# Patient Record
Sex: Female | Born: 1955 | Race: Black or African American | Hispanic: No | Marital: Married | Smoking: Former smoker
Health system: Southern US, Community
[De-identification: ages and names within clinical notes are randomized; demographics above are authoritative.]

## PROBLEM LIST (undated history)

## (undated) DIAGNOSIS — F1910 Other psychoactive substance abuse, uncomplicated: Secondary | ICD-10-CM

## (undated) DIAGNOSIS — E785 Hyperlipidemia, unspecified: Secondary | ICD-10-CM

## (undated) DIAGNOSIS — T7840XA Allergy, unspecified, initial encounter: Secondary | ICD-10-CM

## (undated) HISTORY — DX: Other psychoactive substance abuse, uncomplicated: F19.10

## (undated) HISTORY — DX: Allergy, unspecified, initial encounter: T78.40XA

## (undated) HISTORY — DX: Hyperlipidemia, unspecified: E78.5

---

## 1989-02-18 HISTORY — PX: INDUCED ABORTION: SHX677

## 1998-08-27 ENCOUNTER — Emergency Department (HOSPITAL_COMMUNITY): Admission: EM | Admit: 1998-08-27 | Discharge: 1998-08-27 | Payer: Self-pay | Admitting: *Deleted

## 1999-04-04 ENCOUNTER — Other Ambulatory Visit: Admission: RE | Admit: 1999-04-04 | Discharge: 1999-04-04 | Payer: Self-pay | Admitting: Obstetrics and Gynecology

## 2000-08-25 ENCOUNTER — Other Ambulatory Visit: Admission: RE | Admit: 2000-08-25 | Discharge: 2000-08-25 | Payer: Self-pay | Admitting: Obstetrics and Gynecology

## 2001-08-26 ENCOUNTER — Other Ambulatory Visit: Admission: RE | Admit: 2001-08-26 | Discharge: 2001-08-26 | Payer: Self-pay | Admitting: Obstetrics and Gynecology

## 2004-05-21 ENCOUNTER — Emergency Department (HOSPITAL_COMMUNITY): Admission: EM | Admit: 2004-05-21 | Discharge: 2004-05-22 | Payer: Self-pay | Admitting: Emergency Medicine

## 2005-03-05 ENCOUNTER — Emergency Department (HOSPITAL_COMMUNITY): Admission: EM | Admit: 2005-03-05 | Discharge: 2005-03-05 | Payer: Self-pay | Admitting: Emergency Medicine

## 2005-05-07 ENCOUNTER — Emergency Department (HOSPITAL_COMMUNITY): Admission: EM | Admit: 2005-05-07 | Discharge: 2005-05-07 | Payer: Self-pay | Admitting: Emergency Medicine

## 2005-06-26 ENCOUNTER — Ambulatory Visit (HOSPITAL_COMMUNITY): Admission: RE | Admit: 2005-06-26 | Discharge: 2005-06-26 | Payer: Self-pay | Admitting: Obstetrics

## 2006-01-01 ENCOUNTER — Emergency Department (HOSPITAL_COMMUNITY): Admission: EM | Admit: 2006-01-01 | Discharge: 2006-01-01 | Payer: Self-pay | Admitting: Emergency Medicine

## 2006-02-05 ENCOUNTER — Inpatient Hospital Stay (HOSPITAL_COMMUNITY): Admission: AD | Admit: 2006-02-05 | Discharge: 2006-02-05 | Payer: Self-pay | Admitting: Obstetrics

## 2006-02-25 ENCOUNTER — Inpatient Hospital Stay (HOSPITAL_COMMUNITY): Admission: AD | Admit: 2006-02-25 | Discharge: 2006-02-25 | Payer: Self-pay | Admitting: Obstetrics & Gynecology

## 2006-07-03 ENCOUNTER — Inpatient Hospital Stay (HOSPITAL_COMMUNITY): Admission: AD | Admit: 2006-07-03 | Discharge: 2006-07-03 | Payer: Self-pay | Admitting: Obstetrics & Gynecology

## 2006-07-18 ENCOUNTER — Inpatient Hospital Stay (HOSPITAL_COMMUNITY): Admission: RE | Admit: 2006-07-18 | Discharge: 2006-07-18 | Payer: Self-pay | Admitting: Obstetrics & Gynecology

## 2006-08-29 ENCOUNTER — Encounter: Payer: Self-pay | Admitting: Obstetrics

## 2006-08-29 ENCOUNTER — Ambulatory Visit (HOSPITAL_COMMUNITY): Admission: RE | Admit: 2006-08-29 | Discharge: 2006-08-29 | Payer: Self-pay | Admitting: Obstetrics

## 2006-09-11 ENCOUNTER — Inpatient Hospital Stay (HOSPITAL_COMMUNITY): Admission: RE | Admit: 2006-09-11 | Discharge: 2006-09-11 | Payer: Self-pay | Admitting: Obstetrics & Gynecology

## 2007-01-19 HISTORY — PX: ABDOMINAL HYSTERECTOMY: SHX81

## 2007-01-29 ENCOUNTER — Encounter: Payer: Self-pay | Admitting: Obstetrics

## 2007-01-29 ENCOUNTER — Inpatient Hospital Stay (HOSPITAL_COMMUNITY): Admission: RE | Admit: 2007-01-29 | Discharge: 2007-01-31 | Payer: Self-pay | Admitting: Obstetrics

## 2008-11-25 ENCOUNTER — Ambulatory Visit (HOSPITAL_COMMUNITY): Admission: RE | Admit: 2008-11-25 | Discharge: 2008-11-25 | Payer: Self-pay | Admitting: Obstetrics

## 2009-04-11 ENCOUNTER — Encounter (INDEPENDENT_AMBULATORY_CARE_PROVIDER_SITE_OTHER): Payer: Self-pay | Admitting: *Deleted

## 2009-04-14 ENCOUNTER — Ambulatory Visit: Payer: Self-pay | Admitting: Gastroenterology

## 2009-04-21 ENCOUNTER — Ambulatory Visit: Payer: Self-pay | Admitting: Gastroenterology

## 2009-04-26 ENCOUNTER — Encounter: Payer: Self-pay | Admitting: Gastroenterology

## 2009-12-14 ENCOUNTER — Ambulatory Visit (HOSPITAL_COMMUNITY): Admission: RE | Admit: 2009-12-14 | Discharge: 2009-12-14 | Payer: Self-pay | Admitting: Obstetrics

## 2010-01-24 ENCOUNTER — Ambulatory Visit: Payer: Self-pay | Admitting: Emergency Medicine

## 2010-01-24 DIAGNOSIS — E785 Hyperlipidemia, unspecified: Secondary | ICD-10-CM

## 2010-01-24 DIAGNOSIS — J069 Acute upper respiratory infection, unspecified: Secondary | ICD-10-CM | POA: Insufficient documentation

## 2010-01-29 ENCOUNTER — Telehealth (INDEPENDENT_AMBULATORY_CARE_PROVIDER_SITE_OTHER): Payer: Self-pay | Admitting: *Deleted

## 2010-03-11 ENCOUNTER — Encounter: Payer: Self-pay | Admitting: Obstetrics

## 2010-03-22 NOTE — Progress Notes (Signed)
  Phone Note Outgoing Call Call back at [facility] Phone [phone]   Call placed by: Emilio Math,  January 29, 2010 9:28 AM Call placed to: Patient Summary of Call: Feeling much better and loved our facility and treatment.

## 2010-03-22 NOTE — Assessment & Plan Note (Signed)
Summary: COUGH,CONGESTION/TJ   Vital Signs:  Patient Profile:   55 Years Old Female CC:      pt c/o cough, congestion and chest hurting x 1wk Height:     67 inches Weight:      174.25 pounds O2 Sat:      98 % O2 treatment:    Room Air Temp:     98.5 degrees F oral Pulse rate:   74 / minute Resp:     18 per minute BP sitting:   128 / 85  (left arm) Cuff size:   regular                  Updated Prior Medication List: LAMISIL 250 MG TABS (TERBINAFINE HCL)   Current Allergies: No known allergies History of Present Illness History from: patient Chief Complaint: pt c/o cough, congestion and chest hurting x 1wk History of Present Illness: Patient complains of onset of cold symptoms for 8-9 days.  She has been using nothing OTC.  She thinks she is getting worse in the last few days, especially the cough. They are replacing wet & mildewy carpets in their house and there is a possibility of mold.  She believes this may be a cause of her current illness. + sore throat + cough + pleuritic pain + wheezing + nasal congestion + chest congestion + post-nasal drainage No sinus pain/pressure No itchy/red eyes No earache No hemoptysis No SOB + chills/sweats + fever No nausea No vomiting No abdominal pain No diarrhea No skin rashes No fatigue No myalgias No headache   REVIEW OF SYSTEMS Constitutional Symptoms       Complains of night sweats.     Denies fever, chills, weight loss, weight gain, and fatigue.  Eyes       Denies change in vision, eye pain, eye discharge, glasses, contact lenses, and eye surgery. Ear/Nose/Throat/Mouth       Complains of sore throat.      Denies hearing loss/aids, change in hearing, ear pain, ear discharge, dizziness, frequent runny nose, frequent nose bleeds, sinus problems, hoarseness, and tooth pain or bleeding.  Respiratory       Complains of productive cough, wheezing, and shortness of breath.      Denies dry cough, asthma, bronchitis, and  emphysema/COPD.  Cardiovascular       Complains of chest pain.      Denies murmurs and tires easily with exhertion.    Gastrointestinal       Denies stomach pain, nausea/vomiting, diarrhea, constipation, blood in bowel movements, and indigestion. Genitourniary       Denies painful urination, kidney stones, and loss of urinary control. Neurological       Denies paralysis, seizures, and fainting/blackouts. Musculoskeletal       Denies muscle pain, joint pain, joint stiffness, decreased range of motion, redness, swelling, muscle weakness, and gout.  Skin       Denies bruising, unusual mles/lumps or sores, and hair/skin or nail changes.  Psych       Denies mood changes, temper/anger issues, anxiety/stress, speech problems, depression, and sleep problems. Other Comments: pt c/o cough, chest congestion, chest hurting x 1wk. she states that the cough is productive with yellow phlegm. she has not taken any OTC meds for this.    Past History:  Past Medical History: Hyperlipidemia  Past Surgical History: Hysterectomy  Family History: Family History Diabetes 1st degree relative mother deceased from [place] unsure of type sister mental illness father MI (deceased)  Social  History: Current Smoker 1 PPD Alcohol use-yes 1-3 drinks per mth Drug use-no Smoking Status:  current Drug Use:  no Physical Exam General appearance: well developed, well nourished, mild distress from coughing Nasal: clear discharge Oral/Pharynx: pharyngeal erythema without exudate, uvula midline without deviation Neck: neck supple,  trachea midline, no masses Chest/Lungs: scattered wheezes and rhonchi throughout all lobes but worse LLL Heart: regular rate and  rhythm, no murmur Skin: no obvious rashes or lesions MSE: oriented to time, place, and person Assessment New Problems: COUGH (ICD-786.2) FAMILY HISTORY DIABETES 1ST DEGREE RELATIVE (ICD-V18.0) HYPERLIPIDEMIA (ICD-272.4) UPPER RESPIRATORY INFECTION  (ICD-465.9)  Acute bronchitis CXR obtained to r/o pneumonia = hyperinflation but no airspace disease  Patient Education: Patient and/or caregiver instructed in the following: rest, fluids, Tylenol prn.  Plan New Medications/Changes: CHERATUSSIN AC 100-10 MG/5ML SYRP (GUAIFENESIN-CODEINE) 5cc Q4-6 hrs as needed for cough  #6oz x 0, 01/24/2010, Hoyt Koch MD ZITHROMAX Z-PAK 250 MG TABS (AZITHROMYCIN) use as directed  #1 x 0, 01/24/2010, Hoyt Koch MD  New Orders: New Patient Level III [phone] T-Chest x-ray, 2 views [71020] Pulse Oximetry (single measurment) [94760] Rocephin  250mg  [J0696] Admin of Therapeutic Inj  intramuscular or subcutaneous [96372] Planning Comments:   1)  Take the prescribed antibiotic as instructed. 2)  Use nasal saline solution (over the counter) at least 3 times a day. 3)  Use over the counter decongestants like Zyrtec-D every 12 hours as needed to help with congestion. 4)  Can take tylenol every 6 hours or motrin every 8 hours for pain or fever. 5)  Follow up with your primary doctor  if no improvement in 5-7 days, sooner if increasing pain, fever, or new symptoms.   Two 5cc samples of Rezira (hydrocodone + sudafed) given for tonight   The patient and/or caregiver has been counseled thoroughly with regard to medications prescribed including dosage, schedule, interactions, rationale for use, and possible side effects and they verbalize understanding.  Diagnoses and expected course of recovery discussed and will return if not improved as expected or if the condition worsens. Patient and/or caregiver verbalized understanding.  Prescriptions: CHERATUSSIN AC 100-10 MG/5ML SYRP (GUAIFENESIN-CODEINE) 5cc Q4-6 hrs as needed for cough  #6oz x 0   Entered and Authorized by:   Hoyt Koch MD   Signed by:   Hoyt Koch MD on 01/24/2010   Method used:   Print then Give to Patient   RxID:   [id number] VZDGLOVFI Z-PAK 250 MG TABS  (AZITHROMYCIN) use as directed  #1 x 0   Entered and Authorized by:   Hoyt Koch MD   Signed by:   Hoyt Koch MD on 01/24/2010   Method used:   Print then Give to Patient   RxID:   [id number]   Medication Administration  Injection # 1:    Medication: Rocephin  250mg     Diagnosis: COUGH (ICD-786.2)    Route: IM    Site: RUOQ gluteus    Exp Date: 06/18/2012    Lot #: [id number]    Mfr: sandoz    Comments: 1 gram given    Patient tolerated injection without complications    Given by: Clemens Catholic LPN (January 24, 2010 8:09 PM)  Orders Added: 1)  New Patient Level III [99203] 2)  T-Chest x-ray, 2 views [71020] 3)  Pulse Oximetry (single measurment) [94760] 4)  Rocephin  250mg  [J0696] 5)  Admin of Therapeutic Inj  intramuscular or subcutaneous [93235]

## 2010-03-22 NOTE — Procedures (Signed)
Summary: Colonoscopy  Patient: [name] Note: All result statuses are Final unless otherwise noted.  Tests: (1) Colonoscopy (COL)   COL Colonoscopy           DONE     [facility]     [address]     [place], [place]  [address]           COLONOSCOPY PROCEDURE REPORT           PATIENT:  [name], [name]  MR#:  [id number]     BIRTHDATE:  1955-05-08, 53 yrs. old  GENDER:  female           ENDOSCOPIST:  Candice Hair. Arlyce Dice, MD     Referred by:           PROCEDURE DATE:  04/21/2009     PROCEDURE:  Colonoscopy with snare polypectomy     ASA CLASS:  Class II     INDICATIONS:  Elevated Risk Screening, family history of colon     cancer Mother at 29           MEDICATIONS:   Fentanyl 100 mcg IV, Versed 10 mg IV           DESCRIPTION OF PROCEDURE:   After the risks benefits and     alternatives of the procedure were thoroughly explained, informed     consent was obtained.  Digital rectal exam was performed and     revealed no abnormalities.   The LB CF-[id number] endoscope     was introduced through the anus and advanced to the cecum, which     was identified by both the appendix and ileocecal valve, without     limitations.  The quality of the prep was Moviprep fair.  The     instrument was then slowly withdrawn as the colon was fully     examined.     <<PROCEDUREIMAGES>>           FINDINGS:  A sessile polyp was found in the descending colon. It     was 3 mm in size. Polyp was snared without cautery. Retrieval was     successful (see image11). snare polyp  A single polyp was found in     the sigmoid colon. It was 3 mm in size. It was found 25 cm from     the point of entry. Polyp was snared without cautery. Retrieval     was successful (see image12). snare polyp  Scattered diverticula     were found in the descending colon (see image1). and hepatic     flexure  This was otherwise a normal examination of the colon (see     image3, image5, and image13).   Retroflexed  views in the rectum     revealed no abnormalities.    The scope was then withdrawn from     the patient and the procedure completed.           COMPLICATIONS:  None           ENDOSCOPIC IMPRESSION:     1) 3 mm sessile polyp in the descending colon     2) 3 mm polyp, multiple in the sigmoid colon     3) Diverticula, scattered in the descending colon and hepatic     flexure     4) Otherwise normal examination     RECOMMENDATIONS:     1) Given your significant family history of colon cancer, you  should have a repeat colonoscopy in 5 years           REPEAT EXAM:  In 5 year(s) for Colonoscopy.           ______________________________     Candice Hair. Arlyce Dice, MD           CC:  Candice Ceo MD           n.     [name]:   [name] at 04/21/2009 03:40 PM           Balinda Quails, [id number]  Note: An exclamation mark (!) indicates a result that was not dispersed into the flowsheet. Document Creation Date: 04/21/2009 3:41 PM _______________________________________________________________________  (1) Order result status: Final Collection or observation date-time: 04/21/2009 15:30 Requested date-time:  Receipt date-time:  Reported date-time:  Referring Physician:   Ordering Physician: [name] [phone]) Specimen Source:  Source: Launa Grill Order Number: [id number] Lab site:   Appended Document: Colonoscopy recall     Procedures Next Due Date:    Colonoscopy: 04/2014

## 2010-03-22 NOTE — Miscellaneous (Signed)
Summary: DIR COL/AGE...AS.  Clinical Lists Changes  Medications: Added new medication of MOVIPREP 100 GM  SOLR (PEG-KCL-NACL-NASULF-NA ASC-C) As directed - Signed Rx of MOVIPREP 100 GM  SOLR (PEG-KCL-NACL-NASULF-NA ASC-C) As directed;  #1 x 0;  Signed;  Entered by: Clide Cliff RN;  Authorized by: Louis Meckel MD;  Method used: Electronically to [facility] [address] [phone]*, [address], [address] [place], [place]  [address], Ph: [phone], Fax: [phone] Observations: Added new observation of ALLERGY REV: Done (04/14/2009 13:00)    Prescriptions: MOVIPREP 100 GM  SOLR (PEG-KCL-NACL-NASULF-NA ASC-C) As directed  #1 x 0   Entered by:   Clide Cliff RN   Authorized by:   Louis Meckel MD   Signed by:   Clide Cliff RN on 04/14/2009   Method used:   Electronically to        [facility] [address] [phone]* (retail)       [address]       [address]       [place], [place]  [address]       Ph: [phone]       Fax: [phone]   RxID:   [id number]

## 2010-03-22 NOTE — Letter (Signed)
Summary: Previsit letter  [facility]  [address] [place], [place] [address]   Phone: [phone]  Fax: [phone]       04/11/2009 MRN: [id number]  [facility] [address] [place], [place]  [address]  Dear Ms. Mun,  Welcome to the Gastroenterology Division at [facility].    You are scheduled to see a nurse for your pre-procedure visit on 04/14/2009 at 1:00PM on the 3rd floor at [facility], [address]  We ask that you try to arrive at our office 15 minutes prior to your appointment time to allow for check-in.  Your nurse visit will consist of discussing your medical and surgical history, your immediate family medical history, and your medications.    Please bring a complete list of all your medications or, if you prefer, bring the medication bottles and we will list them.  We will need to be aware of both prescribed and over the counter drugs.  We will need to know exact dosage information as well.  If you are on blood thinners (Coumadin, Plavix, Aggrenox, Ticlid, etc.) please call our office today/prior to your appointment, as we need to consult with your physician about holding your medication.   Please be prepared to read and sign documents such as consent forms, a financial agreement, and acknowledgement forms.  If necessary, and with your consent, a friend or relative is welcome to sit-in on the nurse visit with you.  Please bring your insurance card so that we may make a copy of it.  If your insurance requires a referral to see a specialist, please bring your referral form from your primary care physician.  No co-pay is required for this nurse visit.     If you cannot keep your appointment, please call [phone] to cancel or reschedule prior to your appointment date.  This allows [place] the opportunity to schedule an appointment for another patient in need of care.    Thank you for choosing [facility] for your medical needs.   We appreciate the opportunity to care for you.  Please visit [place] at our website  to learn more about our practice.                     Sincerely.                                                                                                                   The Gastroenterology Division

## 2010-03-22 NOTE — Letter (Signed)
Summary: Out of Work  [facility]  [address]   [place], [place] [address]   Phone: [phone]  Fax: [phone]    January 24, 2010   Employee:  [employer]    To Whom It May Concern:   The above named patient came to the doctor's office today and is being treated for acute bronchitis.  If you need additional information, please feel free to contact our office.         Sincerely,    Hoyt Koch MD

## 2010-03-22 NOTE — Letter (Signed)
Summary: Patient Notice-Hyperplastic Polyps  [facility]  [address] [place], [place] [address]   Phone: [phone]  Fax: [phone]        April 26, 2009 MRN: [id number]    [facility] [address] [place], [place]  [address]    Dear [name],  I am pleased to inform you that the colon polyp(s) removed during your recent colonoscopy was (were) found to be hyperplastic.  These types of polyps are NOT pre-cancerous.  It is  my recommendation that you have a repeat colonoscopy examination in 5_ years for high risk colorectal cancer screening (positive family history).  Should you develop new or worsening symptoms of abdominal pain, bowel habit changes or bleeding from the rectum or bowels, please schedule an evaluation with either your primary care physician or with me.  Additional information/recommendations:  __No further action with gastroenterology is needed at this time.      Please follow-up with your primary care physician for your other      healthcare needs. __Please call [phone] to schedule a return visit to review      your situation.  __Please keep your follow-up visit as already scheduled.  _x_Continue treatment plan as outlined the day of your exam.  Please call us if you are having persistent problems or have questions about your condition that have not been fully answered at this time.  Sincerely,  Louis Meckel MD This letter has been electronically signed by your physician.  Appended Document: Patient Notice-Hyperplastic Polyps Letter mailed 3.10.11

## 2010-03-22 NOTE — Letter (Signed)
Summary: [facility] Instructions  [facility]  [address] [place], [place] [address]   Phone: [phone]  Fax: [phone]       [name]    1955/10/30    MRN: [id number]        Procedure Day /Date: Friday 04/21/2009     Arrival Time: 2:00 pm      Procedure Time: 3:00 pm     Location of Procedure:                    _x _  [facility] (4th Floor)  PREPARATION FOR COLONOSCOPY WITH MOVIPREP   Starting 5 days prior to your procedure Sunday 2/27 do not eat nuts, seeds, popcorn, corn, beans, peas,  salads, or any raw vegetables.  Do not take any fiber supplements (e.g. Metamucil, Citrucel, and Benefiber).  THE DAY BEFORE YOUR PROCEDURE         DATE: Thursday 3/3  1.  Drink clear liquids the entire day-NO SOLID FOOD  2.  Do not drink anything colored red or purple.  Avoid juices with pulp.  No orange juice.  3.  Drink at least 64 oz. (8 glasses) of fluid/clear liquids during the day to prevent dehydration and help the prep work efficiently.  CLEAR LIQUIDS INCLUDE: Water Jello Ice Popsicles Tea (sugar ok, no milk/cream) Powdered fruit flavored drinks Coffee (sugar ok, no milk/cream) Gatorade Juice: apple, white grape, white cranberry  Lemonade Clear bullion, consomm, broth Carbonated beverages (any kind) Strained chicken noodle soup Hard Candy                             4.  In the morning, mix first dose of MoviPrep solution:    Empty 1 Pouch A and 1 Pouch B into the disposable container    Add lukewarm drinking water to the top line of the container. Mix to dissolve    Refrigerate (mixed solution should be used within 24 hrs)  5.  Begin drinking the prep at 5:00 p.m. The MoviPrep container is divided by 4 marks.   Every 15 minutes drink the solution down to the next mark (approximately 8 oz) until the full liter is complete.   6.  Follow completed prep with 16 oz of clear liquid of your choice (Nothing red or purple).  Continue to  drink clear liquids until bedtime.  7.  Before going to bed, mix second dose of MoviPrep solution:    Empty 1 Pouch A and 1 Pouch B into the disposable container    Add lukewarm drinking water to the top line of the container. Mix to dissolve    Refrigerate  THE DAY OF YOUR PROCEDURE      DATE: Friday 3/4  Beginning at 10:00 a.m. (5 hours before procedure):         1. Every 15 minutes, drink the solution down to the next mark (approx 8 oz) until the full liter is complete.  2. Follow completed prep with 16 oz. of clear liquid of your choice.    3. You may drink clear liquids until 1:00 pm  (2 HOURS BEFORE PROCEDURE).   MEDICATION INSTRUCTIONS  Unless otherwise instructed, you should take regular prescription medications with a small sip of water   as early as possible the morning of your procedure.           OTHER INSTRUCTIONS  You will need a responsible adult at  least 53 years of age to accompany you and drive you home.   This person must remain in the waiting room during your procedure.  Wear loose fitting clothing that is easily removed.  Leave jewelry and other valuables at home.  However, you may wish to bring a book to read or  an iPod/MP3 player to listen to music as you wait for your procedure to start.  Remove all body piercing jewelry and leave at home.  Total time from sign-in until discharge is approximately 2-3 hours.  You should go home directly after your procedure and rest.  You can resume normal activities the  day after your procedure.  The day of your procedure you should not:   Drive   Make legal decisions   Operate machinery   Drink alcohol   Return to work  You will receive specific instructions about eating, activities and medications before you leave.    The above instructions have been reviewed and explained to me by   Clide Cliff, RN_______________________    I fully understand and can verbalize these instructions  _____________________________ Date _________

## 2010-07-03 NOTE — Op Note (Signed)
[name], [name]              ACCOUNT NO.:  [id number]   MEDICAL RECORD NO.:  [id number]          PATIENT TYPE:  AMB   LOCATION:  SDC                           FACILITY:  [facility]   PHYSICIAN:  Charles A. Clearance Coots, M.D.DATE OF BIRTH:  Apr 03, 1955   DATE OF PROCEDURE:  08/29/2006  DATE OF DISCHARGE:                               OPERATIVE REPORT   PREOPERATIVE DIAGNOSIS:  Menorrhagia, uterine fibroids.   POSTOPERATIVE DIAGNOSIS:  Menorrhagia, uterine fibroids, large submucous  fibroid.   PROCEDURE:  Hysteroscopy D&C, unsuccessful attempt at NovaSure bipolar  ablation.   SURGEON:  Coral Ceo, M.D.   ANESTHESIA:  General.   ESTIMATED BLOOD LOSS:  25 mL   COMPLICATIONS:  None   SPECIMEN:  Endometrial curettings.   FINDINGS:  Large obstructive submucous endometrial cavity fibroids and  that made endometrial ablation not possible.   OPERATION:  The patient is brought to operating room after satisfactory  general endotracheal anesthesia. The vagina was prepped and draped in  the usual sterile fashion.  The urinary bladder was emptied of  approximately 50 mL of clear urine.  Bimanual examination revealed  uterus to be about 12/14 weeks', mid position. Sterile speculum was  inserted vaginal vault, cervix was isolated, anterior lip of the cervix  was grasped with a single-tooth tenaculum. The uterus was sounded to 11  cm. The length of the cervical canal was measured 4 cm from external os  to the internal os with a Hagar dilator. The cervix was then dilated to  a 25 mm. The 5 mm hysteroscope was then introduced into the uterine  cavity and upon observation posterior uterine cavity was filled with a  large submucous fibroid. The hysteroscope was then removed.  An attempt  was made to proceed with bipolar ablation but the measurements of the  cavity width was less than 2.5 cm and therefore the attempt at bipolar  ablation was abandoned because of the obstruction from the submucous  fibroid not allowing the bipolar, array to expand for proper ablation.  Prior to the attempt at endometrial ablation, the endometrial cavity was  thoroughly curetted with a small sharp  curette and the specimens were submitted to pathology for evaluation.  There was no active bleeding at the conclusion of the procedure. All  instruments were retired. The patient tolerated the procedure well,  transported to recovery in satisfactory condition.      Charles A. Clearance Coots, M.D.  Electronically Signed     CAH/MEDQ  D:  08/29/2006  T:  08/29/2006  Job:  [id number]

## 2010-07-03 NOTE — Discharge Summary (Signed)
[name], [name]               ACCOUNT NO.:  [id number]   MEDICAL RECORD NO.:  [id number]          PATIENT TYPE:  INP   LOCATION:  9315                          FACILITY:  [facility]   PHYSICIAN:  Charles A. Clearance Coots, M.D.DATE OF BIRTH:  1955-08-19   DATE OF ADMISSION:  01/29/2007  DATE OF DISCHARGE:  01/31/2007                               DISCHARGE SUMMARY   ADMISSION DIAGNOSES:  1. Symptomatic uterine fibroids.  2. Menorrhagia.   DISCHARGE DIAGNOSES:  1. Symptomatic uterine fibroids.  2. Menorrhagia.  3. Status post total abdominal hysterectomy .   Discharged home on postop day #2 in good condition   REASON FOR ADMISSION:  A 55 year old G3, P1, black female with history  of heavy painful periods for the past year. She underwent D&C on August 28, 2006, but continued to have heavy periods. Various medical  management was also tried with hormonal therapy and nonsteroidal therapy  without success. The patient desired definitive surgical management.  Total abdominal hysterectomy was recommended and agreed to.   PAST MEDICAL HISTORY:   SURGERY:  D&C.   ILLNESSES:  None.   MEDICATIONS:  Premarin, Provera, nonsteroidal anti-inflammatory agents.   SOCIAL HISTORY:  Married.  Positive tobacco, alcohol.  Negative  recreational drug use.   PHYSICAL EXAMINATION:  GENERAL:  A well-nourished, well-developed female  in no acute distress.  HEENT:  Exam was normal.  LUNGS:  Clear to auscultation bilaterally.  HEART:  Regular rate and rhythm.  ABDOMEN:  Soft, nontender.  PELVIC:  Exam normal external female genitalia.  Vaginal mucosa normal.  Cervix parous without lesions or discharge.  Uterus 12 to 14-week size,  irregular contour. Adnexa without masses or tenderness.  EXTREMITIES:  Without cyanosis, clubbing or edema.   IMPRESSION:  1. Symptomatic uterine fibroids.  2. Menorrhagia.   PLAN:  Total abdominal hysterectomy.   ADMITTING LABORATORY VALUES:  Hemoglobin 12, hematocrit 38,  white blood  cell count 5300, platelets 332,000.   HOSPITAL COURSE:  The patient underwent total abdominal hysterectomy on  January 29, 2007.  There were no intraoperative complications.  Postoperative course was uncomplicated. The patient was discharged home  on postop day #2 in good condition.   DISCHARGE LABORATORY VALUES:  Hemoglobin 11, hematocrit 34, white blood  cell count 9200, platelets 287,000.   DISCHARGE MEDICATIONS:  Tylox and ibuprofen prescribed for pain.   Routine written instructions were given for discharge after  hysterectomy.  The patient is to call the office for a followup  appointment 2 weeks.      Charles A. Clearance Coots, M.D.  Electronically Signed     CAH/MEDQ  D:  01/31/2007  T:  02/01/2007  Job:  [id number]

## 2010-07-03 NOTE — Op Note (Signed)
[name], [name]               ACCOUNT NO.:  [id number]   MEDICAL RECORD NO.:  [id number]          PATIENT TYPE:  INP   LOCATION:  9315                          FACILITY:  [facility]   PHYSICIAN:  Charles A. Clearance Coots, M.D.DATE OF BIRTH:  [birth year]   DATE OF PROCEDURE:  01/29/2007  DATE OF DISCHARGE:                               OPERATIVE REPORT   PREOPERATIVE DIAGNOSIS:  Symptomatic uterine fibroids, menorrhagia.   POSTOPERATIVE DIAGNOSIS:  Symptomatic uterine fibroids, menorrhagia.   PROCEDURE:  Total abdominal hysterectomy.   SURGEON:  Charles A. Clearance Coots, M.D.   ASSISTANT:  [name], M.D.   ANESTHESIA:  General.   ESTIMATED BLOOD LOSS:  200 mL.   IV FLUIDS:  2000 mL.   URINE OUTPUT:  200 mL.   COMPLICATIONS:  None.   DRAINS:  Foley to gravity.   FINDINGS:  Multiple uterine fibroids.   SPECIMEN:  Uterus with cervix.   DISPOSITION OF SPECIMEN:  Pathology.   OPERATION:  The patient was brought to operating room.  After  satisfactory general endotracheal anesthesia, the abdomen was prepped  and draped in the usual sterile fashion.  An indwelling Foley catheter  was placed in the bladder.  A Pfannenstiel skin incision was made with  the scalpel down through the previous scar, down to the fascia.  The  fascia was nicked in the midline and the fascial incision was extended  to left and to right with curved Mayo scissors.  The superior and  inferior fascial edges were taken off the rectus muscles with both blunt  and sharp dissection.  The rectus muscle was bluntly divided in midline  and peritoneum was entered digitally, and was digitally extended to the  left and to the right.  The O'Connor-O'Sullivan retractor was then  placed in the incision and the bowel was packed off.  The anterior and  posterior blades were placed; the uterus was elevated into the incision.  The utero-ovarian and broad ligaments were grasped across the cornua  with large Kelly forceps  bilaterally.  The uterus was further  exteriorized into the incision.  The round ligaments were transected and  suture ligated bilaterally with 0 Vicryl.  The vesicouterine fold of  peritoneum above the reflection of the urinary bladder was then  undermined and incised with electrocautery, and the urinary bladder was  dissected away from the lower uterine segment and cervix, far out of the  operative field.  The broad ligament was then tented medially at the  border of the medial wall of the uterus digitally.  The utero-ovarian  and broad ligaments were doubly clamped with parametrial clamps, cut  with curved Mayo scissors, and a free tie of 0 Vicryl was placed beneath  the clamp.  This was followed with a transfixion suture of 0 Vicryl  above the mark.  Hemostasis was excellent.   The same procedure was performed on the opposite side without  complications.  The uterine vessels were then skeletonized and were  doubly clamped at a right angle; transected and suture ligated with  transfixion suture of 0 Vicryl bilaterally.  The cardinal  ligaments were  then clamped, transected and suture ligated with transfixion sutures of  0 Vicryl down to the uterosacral ligaments.  The uterosacral ligaments  were clamped, cut and suture ligated with transfixion sutures of 0  Vicryl bilaterally.  These sutures were held with the Hemostat for later  identification and closure of the vaginal cuff.  The vaginal cuff was  then crossclamped with 2 parametrial clamps meeting in the center, and  the cervix was then transected and submitted to pathology for  evaluation.   The uterus had been transected at the internal os of the cervix after  the uterine vessels were secured, in order to obtain better exposure and  removal of the cervix.  The vaginal cuff was then suture ligated with  transfixion sutures of 0 Vicryl in each corner, followed by closure of  the center of the vaginal cuff with interrupted sutures  of 0 Vicryl.  Hemostasis was excellent.  The pelvic cavity was then thoroughly  irrigated with warm saline solution, and all clots were removed.  Closure of the vaginal cuff was again inspected for hemostasis and there  was no active bleeding.  All pedicles were inspected for hemostasis, and  there was no active bleeding from any of the pedicles.  The self-  retaining retractor was then removed and all packing was removed.  The  surgical technician indicated that all counts were correct at this  point.   The abdomen was then closed as follows.  The peritoneum was closed with  continuous suture of 2-0 Monocryl.  The fascia was closed with a  continuous suture of 0 PDS from each corner to the center.  Subcutaneous  tissue was thoroughly irrigated with warm saline solution and all areas  of subcutaneous bleeding were coagulated with the Bovie.  Skin was then  closed with continuous subcuticular suture of 3-0 Monocryl.  A sterile  bandage was applied to the incision closure.  The surgical technician  indicated that all needle, sponge and instrument counts were correct.  The patient tolerated the procedure well, was transported to recovery  room in satisfactory condition.      Charles A. Clearance Coots, M.D.  Electronically Signed     CAH/MEDQ  D:  01/29/2007  T:  01/29/2007  Job:  [id number]

## 2010-11-26 LAB — CBC
Platelets: 332
WBC: 5.3

## 2010-12-04 LAB — CBC
HCT: 39.4
MCHC: 32.1
MCV: 78.8

## 2011-03-05 ENCOUNTER — Emergency Department
Admission: EM | Admit: 2011-03-05 | Discharge: 2011-03-05 | Disposition: A | Discharge status: Home / Self Care | Payer: Managed Care, Other (non HMO) | Source: Home / Self Care | Attending: Family Medicine | Admitting: Family Medicine

## 2011-03-05 DIAGNOSIS — J069 Acute upper respiratory infection, unspecified: Secondary | ICD-10-CM

## 2011-03-05 MED ORDER — BENZONATATE 200 MG PO CAPS: 200.0000 mg | ORAL_CAPSULE | Freq: Every day | ORAL | Status: AC

## 2011-03-05 MED ORDER — DOXYCYCLINE HYCLATE 100 MG PO TABS: 100.0000 mg | ORAL_TABLET | Freq: Two times a day (BID) | ORAL | Status: AC

## 2011-03-05 NOTE — ED Provider Notes (Signed)
History     CSN: [id number]  Arrival date & time 03/05/11  1807   First MD Initiated Contact with Patient 03/05/11 1821      Chief Complaint  Patient presents with  . URI      HPI Comments: Patient complains of approximately 4 day history of gradually progressive URI symptoms beginning with a mild sore throat (now improved), followed by progressive nasal congestion.  A cough started about 2 days ago.  Complains of fatigue and initial low back ache (now resolved with ibuprofen).  Cough is now worse at night and generally non-productive during the day.  There has been no pleuritic pain, shortness of breath, or wheezes.   She is a smoker and has had bronchitis in the past.  Patient is a 56 y.o. female presenting with URI. The history is provided by the patient.  URI The primary symptoms include fatigue, headaches, sore throat and cough. Primary symptoms do not include fever, ear pain, swollen glands, wheezing, abdominal pain, nausea, vomiting, myalgias, arthralgias or rash. The current episode started 3 to 5 days ago. This is a new problem.  Symptoms associated with the illness include chills, congestion and rhinorrhea. The illness is not associated with plugged ear sensation, facial pain or sinus pressure.    History reviewed. No pertinent past medical history.  History reviewed. No pertinent past surgical history.  No family history on file.  History  Substance Use Topics  . Smoking status: Current Everyday Smoker  . Smokeless tobacco: Not on file  . Alcohol Use: No    OB History    Grav Para Term Preterm Abortions TAB SAB Ect Mult Living                  Review of Systems  Constitutional: Positive for chills and fatigue. Negative for fever.  HENT: Positive for congestion, sore throat and rhinorrhea. Negative for ear pain and sinus pressure.   Respiratory: Positive for cough. Negative for wheezing.   Gastrointestinal: Negative for nausea, vomiting and abdominal pain.    Musculoskeletal: Negative for myalgias and arthralgias.  Skin: Negative for rash.  Neurological: Positive for headaches.   + mild sore throat + cough No pleuritic pain No wheezing + nasal congestion + post-nasal drainage No sinus pain/pressure No itchy/red eyes No earache No hemoptysis No SOB No fever, + chills No nausea No vomiting No abdominal pain No diarrhea No urinary symptoms No skin rashes + fatigue No myalgias, but had low back pain initially; no urinary symptoms  + headache Used OTC meds without relief  Allergies  Review of patient's allergies indicates no known allergies.  Home Medications   Current Outpatient Rx  Name Route Sig Dispense Refill  . BENZONATATE 200 MG PO CAPS Oral Take 1 capsule (200 mg total) by mouth at bedtime. Take as needed for cough 12 capsule 0  . DOXYCYCLINE HYCLATE 100 MG PO TABS Oral Take 1 tablet (100 mg total) by mouth 2 (two) times daily. 20 tablet 0    BP 119/81  Pulse 85  Temp(Src) 97.9 F (36.6 C) (Oral)  Resp 16  Ht 5\' 7"  (1.702 m)  Wt 182 lb (82.555 kg)  BMI 28.51 kg/m2  SpO2 98%  Physical Exam Nursing notes and Vital Signs reviewed. Appearance:  Patient appears healthy, stated age, and in no acute distress Eyes:  Pupils are equal, round, and reactive to light and accomodation.  Extraocular movement is intact.  Conjunctivae are not inflamed  Ears:  Canals normal.  Tympanic membranes normal.  Nose:  Mildly congested turbinates.  No sinus tenderness.  Pharynx:  Normal Neck:  Supple.  No adenopathy Lungs:  Clear to auscultation.  Breath sounds are equal. Chest:  Distinct tenderness to palpation over the upper sternum.  Heart:  Regular rate and rhythm without murmurs, rubs, or gallops.  Abdomen:  Nontender without masses or hepatosplenomegaly.  Bowel sounds are present.  No CVA or flank tenderness.  Extremities:  No edema.  No calf tenderness Skin:  No rash present.   ED Course  Procedures  none      1. Acute  upper respiratory infections of unspecified site       MDM  With a past history of bronchitis and smoker, will begin empiric doxycycline; Tessalon at bedtime.   Take Mucinex D (guaifenesin with decongestant) twice daily for congestion.  Increase fluid intake, rest. May use Afrin nasal spray (or generic oxymetazoline) twice daily for about 5 days.  Also recommend using saline nasal spray several times daily and saline nasal irrigation (AYR is a common brand) Stop all antihistamines for now, and other non-prescription cough/cold preparations. May take Ibuprofen 200mg , 4 tabs every 8 hours with food for chest/sternum discomfort or back pain Follow-up with family doctor if not improving 7 to 10 days.          Donna Christen, MD 03/05/11 [phone]

## 2011-03-05 NOTE — ED Notes (Signed)
Pt c/o cough, runny nose and back spasms.  Pt states onset Saturday. Pt states she took one of her husbands pain pills for back spasms with relief.  Pt denies fever.  Pt denies N/V/D.

## 2012-09-14 ENCOUNTER — Encounter: Payer: Self-pay | Admitting: Obstetrics

## 2012-09-14 ENCOUNTER — Ambulatory Visit (INDEPENDENT_AMBULATORY_CARE_PROVIDER_SITE_OTHER): Payer: BC Managed Care – PPO | Admitting: Obstetrics

## 2012-09-14 VITALS — BP 120/88 | HR 72 | Temp 97.1°F | Wt 183.0 lb

## 2012-09-14 DIAGNOSIS — Z01419 Encounter for gynecological examination (general) (routine) without abnormal findings: Secondary | ICD-10-CM

## 2012-09-14 DIAGNOSIS — Z7189 Other specified counseling: Secondary | ICD-10-CM

## 2012-09-14 DIAGNOSIS — N760 Acute vaginitis: Secondary | ICD-10-CM

## 2012-09-14 DIAGNOSIS — Z716 Tobacco abuse counseling: Secondary | ICD-10-CM

## 2012-09-14 MED ORDER — VARENICLINE TARTRATE 1 MG PO TABS: 1.0000 mg | ORAL_TABLET | Freq: Two times a day (BID) | ORAL | Status: DC

## 2012-09-14 MED ORDER — VARENICLINE TARTRATE 0.5 MG PO TABS: ORAL_TABLET | ORAL | Status: DC

## 2012-09-14 NOTE — Progress Notes (Signed)
Subjective:     [name] is a 57 y.o. female here for a routine annual examination.  Current complaints: wants to stop smoking and is requesting lipid panel.  Personal health questionnaire reviewed: not asked.   Gynecologic History No LMP recorded. Patient has had a hysterectomy. Contraception: status post hysterectomy Last Pap: 01/2007. Results were: normal Last mammogram: 11/2011. Results were: normal    The following portions of the patient's history were reviewed and updated as appropriate: allergies, current medications, past family history, past medical history, past social history, past surgical history and problem list.  Review of Systems Pertinent items are noted in HPI.    Objective:    General appearance: alert and no distress Breasts: normal appearance, no masses or tenderness Abdomen: normal findings: soft, non-tender Pelvic: NEFG.  Vagina and cuff clean.  Cervix absent.  Uterus absent.  No adnexal masses, tenderness. Extremities: extremities normal, atraumatic, no cyanosis or edema Skin: Skin color, texture, turgor normal. No rashes or lesions    Assessment:    Healthy female exam.   Counseling for smoking cessation requested.  Patient wants meds to help.   Plan:    Education reviewed: calcium supplements, low fat, low cholesterol diet, self breast exams and smoking cessation. Follow up in: 1 year. Chantix Rx.

## 2012-09-15 LAB — WET PREP BY MOLECULAR PROBE: Gardnerella vaginalis: NEGATIVE

## 2013-01-26 ENCOUNTER — Other Ambulatory Visit: Payer: Self-pay | Admitting: Obstetrics

## 2013-01-26 DIAGNOSIS — Z1231 Encounter for screening mammogram for malignant neoplasm of breast: Secondary | ICD-10-CM

## 2013-01-29 ENCOUNTER — Ambulatory Visit (HOSPITAL_COMMUNITY)
Admission: RE | Admit: 2013-01-29 | Discharge: 2013-01-29 | Disposition: A | Discharge status: Home / Self Care | Payer: BC Managed Care – PPO | Source: Ambulatory Visit | Attending: Obstetrics | Admitting: Obstetrics

## 2013-01-29 DIAGNOSIS — Z1231 Encounter for screening mammogram for malignant neoplasm of breast: Secondary | ICD-10-CM | POA: Insufficient documentation

## 2013-02-10 ENCOUNTER — Ambulatory Visit (HOSPITAL_COMMUNITY): Payer: Managed Care, Other (non HMO)

## 2013-11-13 ENCOUNTER — Encounter: Payer: Self-pay | Admitting: Gastroenterology

## 2014-01-31 ENCOUNTER — Other Ambulatory Visit: Payer: Self-pay | Admitting: Obstetrics

## 2014-01-31 DIAGNOSIS — Z1231 Encounter for screening mammogram for malignant neoplasm of breast: Secondary | ICD-10-CM

## 2014-02-03 ENCOUNTER — Encounter: Payer: Self-pay | Admitting: Gastroenterology

## 2014-02-15 ENCOUNTER — Ambulatory Visit (HOSPITAL_COMMUNITY)
Admission: RE | Admit: 2014-02-15 | Discharge: 2014-02-15 | Disposition: A | Discharge status: Home / Self Care | Payer: PRIVATE HEALTH INSURANCE | Source: Ambulatory Visit | Attending: Obstetrics | Admitting: Obstetrics

## 2014-02-15 DIAGNOSIS — Z1231 Encounter for screening mammogram for malignant neoplasm of breast: Secondary | ICD-10-CM | POA: Insufficient documentation

## 2014-04-11 ENCOUNTER — Telehealth: Payer: Self-pay

## 2014-04-11 NOTE — Telephone Encounter (Signed)
2 day prep 

## 2014-04-11 NOTE — Telephone Encounter (Signed)
Pt had FAIR prep with Moviprep in 2011.  Do you want 2 day prep or is Suprep sufficient?

## 2014-04-28 ENCOUNTER — Encounter: Payer: BLUE CROSS/BLUE SHIELD | Admitting: Gastroenterology

## 2014-05-04 ENCOUNTER — Encounter: Payer: Self-pay | Admitting: Gastroenterology

## 2014-10-05 ENCOUNTER — Encounter: Payer: Self-pay | Admitting: Gastroenterology

## 2014-12-06 ENCOUNTER — Encounter: Payer: Self-pay | Admitting: Gastroenterology

## 2015-01-10 ENCOUNTER — Encounter: Payer: BLUE CROSS/BLUE SHIELD | Admitting: Gastroenterology

## 2015-01-19 ENCOUNTER — Ambulatory Visit (AMBULATORY_SURGERY_CENTER): Payer: Self-pay

## 2015-01-19 VITALS — Ht 67.0 in | Wt 185.8 lb

## 2015-01-19 DIAGNOSIS — Z860100 Personal history of colon polyps, unspecified: Secondary | ICD-10-CM

## 2015-01-19 NOTE — Progress Notes (Signed)
No allergies to eggs or soy No past problems with anesthesia No home oxygen No diet/weight loss  Has email and internet; registered for emmi

## 2015-02-01 ENCOUNTER — Encounter: Payer: BLUE CROSS/BLUE SHIELD | Admitting: Gastroenterology

## 2015-03-09 ENCOUNTER — Ambulatory Visit (AMBULATORY_SURGERY_CENTER): Payer: 59 | Admitting: Gastroenterology

## 2015-03-09 ENCOUNTER — Encounter: Payer: Self-pay | Admitting: Gastroenterology

## 2015-03-09 VITALS — BP 103/68 | HR 88 | Temp 95.6°F | Resp 18 | Ht 67.0 in | Wt 185.0 lb

## 2015-03-09 DIAGNOSIS — K635 Polyp of colon: Secondary | ICD-10-CM

## 2015-03-09 DIAGNOSIS — Z86010 Personal history of colonic polyps: Secondary | ICD-10-CM

## 2015-03-09 DIAGNOSIS — D125 Benign neoplasm of sigmoid colon: Secondary | ICD-10-CM

## 2015-03-09 MED ORDER — SODIUM CHLORIDE 0.9 % IV SOLN: 500.0000 mL | INTRAVENOUS | Status: DC

## 2015-03-09 NOTE — Patient Instructions (Signed)
YOU HAD AN ENDOSCOPIC PROCEDURE TODAY AT [facility]:   Refer to the procedure report that was given to you for any specific questions about what was found during the examination.  If the procedure report does not answer your questions, please call your gastroenterologist to clarify.  If you requested that your care partner not be given the details of your procedure findings, then the procedure report has been included in a sealed envelope for you to review at your convenience later.  YOU SHOULD EXPECT: Some feelings of bloating in the abdomen. Passage of more gas than usual.  Walking can help get rid of the air that was put into your GI tract during the procedure and reduce the bloating. If you had a lower endoscopy (such as a colonoscopy or flexible sigmoidoscopy) you may notice spotting of blood in your stool or on the toilet paper. If you underwent a bowel prep for your procedure, you may not have a normal bowel movement for a few days.  Please Note:  You might notice some irritation and congestion in your nose or some drainage.  This is from the oxygen used during your procedure.  There is no need for concern and it should clear up in a day or so.  SYMPTOMS TO REPORT IMMEDIATELY:   Following lower endoscopy (colonoscopy or flexible sigmoidoscopy):  Excessive amounts of blood in the stool  Significant tenderness or worsening of abdominal pains  Swelling of the abdomen that is new, acute  Fever of 100F or higher   For urgent or emergent issues, a gastroenterologist can be reached at any hour by calling [phone].   DIET: Your first meal following the procedure should be a small meal and then it is ok to progress to your normal diet. Heavy or fried foods are harder to digest and may make you feel nauseous or bloated.  Likewise, meals heavy in dairy and vegetables can increase bloating.  Drink plenty of fluids but you should avoid alcoholic beverages for 24  hours.  ACTIVITY:  You should plan to take it easy for the rest of today and you should NOT DRIVE or use heavy machinery until tomorrow (because of the sedation medicines used during the test).    FOLLOW UP: Our staff will call the number listed on your records the next business day following your procedure to check on you and address any questions or concerns that you may have regarding the information given to you following your procedure. If we do not reach you, we will leave a message.  However, if you are feeling well and you are not experiencing any problems, there is no need to return our call.  We will assume that you have returned to your regular daily activities without incident.  If any biopsies were taken you will be contacted by phone or by letter within the next 1-3 weeks.  Please call us at [phone] if you have not heard about the biopsies in 3 weeks.    SIGNATURES/CONFIDENTIALITY: You and/or your care partner have signed paperwork which will be entered into your electronic medical record.  These signatures attest to the fact that that the information above on your After Visit Summary has been reviewed and is understood.  Full responsibility of the confidentiality of this discharge information lies with you and/or your care-partner.  Polyp/Diverticulosis handout given Await pathology results

## 2015-03-09 NOTE — Progress Notes (Signed)
Called to room to assist during endoscopic procedure.  Patient ID and intended procedure confirmed with present staff. Received instructions for my participation in the procedure from the performing physician.  

## 2015-03-09 NOTE — Op Note (Signed)
[facility]  [address] [place], [id number]   COLONOSCOPY PROCEDURE REPORT  PATIENT: [name], [name]  MR#: [id number] BIRTHDATE: August 19, 1955 , 59  yrs. old GENDER: female ENDOSCOPIST: Harl Bowie, MD REFERRED [id number] PROCEDURE DATE:  03/09/2015 PROCEDURE:   Colonoscopy, surveillance , Colonoscopy with snare polypectomy, and Colonoscopy with cold biopsy polypectomy First Screening Colonoscopy - Avg.  risk and is 50 yrs.  old or older - No.  Prior Negative Screening - Now for repeat screening. N/A  History of Adenoma - Now for follow-up colonoscopy & has been > or = to 3 yrs.  Yes hx of adenoma.  Has been 3 or more years since last colonoscopy.  Polyps removed today? Yes ASA CLASS:   Class II INDICATIONS:Surveillance due to prior colonic neoplasia, PH Colon Adenoma, and FH Colon or Rectal Adenocarcinoma. MEDICATIONS: Propofol 250 mg IV  DESCRIPTION OF PROCEDURE:   After the risks benefits and alternatives of the procedure were thoroughly explained, informed consent was obtained.  The digital rectal exam revealed no abnormalities of the rectum.   The LB [id number]  endoscope was introduced through the anus and advanced to the cecum, which was identified by both the appendix and ileocecal valve. No adverse events experienced.   The quality of the prep was good.  The instrument was then slowly withdrawn as the colon was fully examined. Estimated blood loss is zero unless otherwise noted in this procedure report.   COLON FINDINGS: There was mild diverticulosis noted throughout the entire examined colon.   A sessile polyp ranging between 5-35mm in size was found in the sigmoid colon.  A polypectomy was performed with a cold snare.  The resection was complete, the polyp tissue was completely retrieved and sent to histology.   Three sessile polyps ranging between 3-68mm in size were found.  A polypectomy was performed with cold forceps.  The  resection was complete, the polyp tissue was completely retrieved and sent to histology.  Retroflexed views revealed internal hemorrhoids. The time to cecum = 5.4 Withdrawal time = 9.7   The scope was withdrawn and the procedure completed. COMPLICATIONS: There were no immediate complications.  ENDOSCOPIC IMPRESSION: 1.   Mild diverticulosis was noted throughout the entire examined colon 2.   Sessile polyp ranging between 5-2mm in size was found in the sigmoid colon; polypectomy was performed with a cold snare 3.   Three sessile polyps ranging between 3-44mm in size were found; polypectomy was performed with cold forceps  RECOMMENDATIONS: 1.  If the polyp(s) removed today are proven to be adenomatous (pre-cancerous) polyps, you will need a repeat colonoscopy in 5 years.  You will receive a letter within 1-2 weeks with the results of your biopsy as well as final recommendations.  Please call my office if you have not received a letter after 3 weeks. 2.  Await pathology results  eSigned:  Harl Bowie, MD 03/09/2015 2:45 PM      PATIENT NAME:  [name], [name] MR#: [id number]

## 2015-03-09 NOTE — Progress Notes (Signed)
Report to PACU, RN, vss, BBS= Clear.  

## 2015-03-10 ENCOUNTER — Telehealth: Payer: Self-pay | Admitting: *Deleted

## 2015-03-10 NOTE — Telephone Encounter (Signed)
  Follow up Call-  Call back number 03/09/2015  Post procedure Call Back phone  # [phone]  Permission to leave phone message Yes     Patient questions:  Do you have a fever, pain , or abdominal swelling? No. Pain Score  0 *  Have you tolerated food without any problems? Yes.    Have you been able to return to your normal activities? Yes.    Do you have any questions about your discharge instructions: Diet   No. Medications  No. Follow up visit  No.  Do you have questions or concerns about your Care? No.  Actions: * If pain score is 4 or above: No action needed, pain <4.

## 2015-03-22 ENCOUNTER — Encounter: Payer: Self-pay | Admitting: Gastroenterology

## 2016-08-11 ENCOUNTER — Encounter (HOSPITAL_COMMUNITY): Payer: Self-pay | Admitting: Emergency Medicine

## 2016-08-11 DIAGNOSIS — Z87891 Personal history of nicotine dependence: Secondary | ICD-10-CM | POA: Insufficient documentation

## 2016-08-11 DIAGNOSIS — M546 Pain in thoracic spine: Secondary | ICD-10-CM | POA: Insufficient documentation

## 2016-08-11 NOTE — ED Triage Notes (Signed)
Pt presents to ED for assessment of left sided back and chest pain, starting last night.  Painful when moving, causes the pt to breath shallow.  Pt denies any other associated symptoms.

## 2016-08-11 NOTE — ED Notes (Addendum)
Pt updated on delays and apologized for wait

## 2016-08-11 NOTE — ED Notes (Signed)
Lobby announcement made, wait time explained, and updates offered.   

## 2016-08-12 ENCOUNTER — Emergency Department (HOSPITAL_COMMUNITY): Payer: Managed Care, Other (non HMO)

## 2016-08-12 ENCOUNTER — Emergency Department (HOSPITAL_COMMUNITY)
Admission: EM | Admit: 2016-08-12 | Discharge: 2016-08-12 | Disposition: A | Discharge status: Home / Self Care | Payer: Managed Care, Other (non HMO) | Attending: Emergency Medicine | Admitting: Emergency Medicine

## 2016-08-12 DIAGNOSIS — M546 Pain in thoracic spine: Secondary | ICD-10-CM

## 2016-08-12 MED ORDER — KETOROLAC TROMETHAMINE 30 MG/ML IJ SOLN
30.0000 mg | Freq: Once | INTRAMUSCULAR | Status: DC
  Filled 2016-08-12: qty 1

## 2016-08-12 MED ORDER — OXYCODONE-ACETAMINOPHEN 5-325 MG PO TABS
1.0000 | ORAL_TABLET | Freq: Once | ORAL | Status: AC
  Administered 2016-08-12: 1 via ORAL
  Filled 2016-08-12: qty 1

## 2016-08-12 MED ORDER — CYCLOBENZAPRINE HCL 10 MG PO TABS
5.0000 mg | ORAL_TABLET | Freq: Once | ORAL | Status: AC
  Administered 2016-08-12: 5 mg via ORAL
  Filled 2016-08-12: qty 1

## 2016-08-12 MED ORDER — CYCLOBENZAPRINE HCL 5 MG PO TABS: 5.0000 mg | ORAL_TABLET | Freq: Three times a day (TID) | ORAL | 0 refills | Status: AC | PRN

## 2016-08-12 MED ORDER — NAPROXEN 500 MG PO TABS: 500.0000 mg | ORAL_TABLET | Freq: Two times a day (BID) | ORAL | 0 refills | Status: AC

## 2016-08-12 NOTE — ED Provider Notes (Signed)
[facility] DEPT Provider Note   CSN: [id number] Arrival date & time: 08/11/16  2142  By signing my name below, I, Marcello Moores, attest that this documentation has been prepared under the direction and in the presence of Wesleigh Markovic, Barbette Hair, MD. Electronically Signed: Marcello Moores, ED Scribe. 08/12/16. 2:06 AM.  History   Chief Complaint Chief Complaint  Patient presents with  . Back Pain  . Chest Pain   The history is provided by the patient. No language interpreter was used.   HPI Comments: [name] is a 61 y.o. female who presents to the Emergency Department complaining of a sudden onset of 10/10 consant "stinging" left back pain that began two days ago at 7:00pm. She states that her pain radiates to her left breast that began today. No alleviating factors. She reports that she took ibuprofen, and applied Bengay with inadequate relief.  Twisting her trunk and ambulating normally exacerbates her pain. She states that she was walking when she noticed the pain. The pt has a PMHx of pre-diabetes, hyperlipidemia. The pt has not had the shingles vaccine. She denies rash, nausea, vomiting, and additional complaints at this time.Denies any weakness, numbness, tingling of the lower extremities.   Past Medical History:  Diagnosis Date  . Allergy   . Hyperlipidemia   . Substance abuse    quit 9 yrs ago    Patient Active Problem List   Diagnosis Date Noted  . Encounter for smoking cessation counseling 09/14/2012  . HYPERLIPIDEMIA 01/24/2010  . UPPER RESPIRATORY INFECTION 01/24/2010    Past Surgical History:  Procedure Laterality Date  . ABDOMINAL HYSTERECTOMY  01/2007   Dr. Baltazar Najjar   . INDUCED ABORTION  1991    OB History    No data available       Home Medications    Prior to Admission medications   Medication Sig Start Date End Date Taking? Authorizing Provider  cyclobenzaprine (FLEXERIL) 5 MG tablet Take 1 tablet (5 mg total) by mouth 3 (three)  times daily as needed for muscle spasms. 08/12/16   Lamiracle Chaidez, Barbette Hair, MD  Multiple Vitamin (MULTIVITAMIN) tablet Take 1 tablet by mouth daily.    [provider]  naproxen (NAPROSYN) 500 MG tablet Take 1 tablet (500 mg total) by mouth 2 (two) times daily. 08/12/16   Aria Pickrell, Barbette Hair, MD    Family History Family History  Problem Relation Age of Onset  . Colon cancer Mother   . Heart disease Father   . Diabetes Father     Social History Social History  Substance Use Topics  . Smoking status: Former Smoker    Packs/day: 1.00    Types: Cigarettes    Quit date: 04/08/2013  . Smokeless tobacco: Never Used  . Alcohol use 4.2 oz/week    7 Glasses of wine per week     Allergies   Patient has no known allergies.   Review of Systems Review of Systems  Constitutional: Negative for fever.  Respiratory: Negative for shortness of breath.   Cardiovascular: Negative for chest pain and leg swelling.  Gastrointestinal: Negative for abdominal pain, nausea and vomiting.  Genitourinary: Negative for dysuria and hematuria.  Musculoskeletal: Positive for back pain and myalgias.  Skin: Negative for rash.  Neurological: Negative for weakness and numbness.  Hematological: Does not bruise/bleed easily.  All other systems reviewed and are negative.    Physical Exam Updated Vital Signs BP 132/84   Pulse 71   Temp 97.6 F (36.4 C) (Oral)  Resp 12   SpO2 98%   Physical Exam  Constitutional: She is oriented to person, place, and time. She appears well-developed and well-nourished. No distress.  HENT:  Head: Normocephalic and atraumatic.  Cardiovascular: Normal rate, regular rhythm and normal heart sounds.   No murmur heard. Pulmonary/Chest: Effort normal and breath sounds normal. No respiratory distress. She has no wheezes. She exhibits tenderness.  Tenderness left lateral chest wall, no crepitus  Abdominal: Soft. Bowel sounds are normal. There is no tenderness. There is no  guarding.  Musculoskeletal:  No midline thoracic or lumbar tenderness to palpation, there is point tenderness to palpation over the paraspinous muscle regions left of the thoracic spine, no overlying skin changes, no bruising  Neurological: She is alert and oriented to person, place, and time.  Skin: Skin is warm and dry. No rash noted.  Psychiatric: She has a normal mood and affect.  Nursing note and vitals reviewed.    ED Treatments / Results   DIAGNOSTIC STUDIES: Oxygen Saturation is 98% on RA, normal by my interpretation.   COORDINATION OF CARE: 1:40 AM-Discussed next steps with pt. Pt verbalized understanding and is agreeable with the plan.   Labs (all labs ordered are listed, but only abnormal results are displayed) Labs Reviewed - No data to display  EKG  EKG Interpretation  Date/Time:  Sunday August 11 2016 21:48:56 EDT Ventricular Rate:  94 PR Interval:  156 QRS Duration: 70 QT Interval:  314 QTC Calculation: 392 R Axis:   73 Text Interpretation:  Normal sinus rhythm Possible Left atrial enlargement Septal infarct , age undetermined Abnormal ECG No prior for comparison Confirmed by Thayer Jew [phone]) on 08/12/2016 2:25:29 AM       Radiology Dg Chest 2 View  Result Date: 08/12/2016 CLINICAL DATA:  Left upper back pain for 1 day EXAM: CHEST  2 VIEW COMPARISON:  01/24/2010 FINDINGS: No focal pulmonary infiltrate, consolidation, or pleural effusion. Mildly coarse interstitium, likely chronic change. Normal heart size. No pneumothorax. IMPRESSION: No active cardiopulmonary disease. Electronically Signed   By: Donavan Foil M.D.   On: 08/12/2016 00:10    Procedures Procedures (including critical care time)  Medications Ordered in ED Medications  ketorolac (TORADOL) 30 MG/ML injection 30 mg (30 mg Intramuscular Not Given 08/12/16 0227)  oxyCODONE-acetaminophen (PERCOCET/ROXICET) 5-325 MG per tablet 1 tablet (1 tablet Oral Given 08/12/16 0224)  cyclobenzaprine  (FLEXERIL) tablet 5 mg (5 mg Oral Given 08/12/16 0224)     Initial Impression / Assessment and Plan / ED Course  I have reviewed the triage vital signs and the nursing notes.  Pertinent labs & imaging results that were available during my care of the patient were reviewed by me and considered in my medical decision making (see chart for details).     Patient presents with left-sided thoracic back and flank pain. It does radiate into the right breast. She is nontoxic. EKG is nonischemic. Chest x-ray is negative. She has very reproducible pain on exam. This is most suggestive of musculoskeletal etiology. This could also be a prodrome to shingles. No rash at present. I discussed this with the patient. She did have some improvement with anti-inflammatories and muscle relaxants in the ED. Low suspicion at this time for ACS or PE.  Discussed with patient follow-up closely with her primary physician if symptoms do not improve. She was also given instructions if rash were to appear.  After history, exam, and medical workup I feel the patient has been appropriately medically screened and  is safe for discharge home. Pertinent diagnoses were discussed with the patient. Patient was given return precautions.   Final Clinical Impressions(s) / ED Diagnoses   Final diagnoses:  Acute left-sided thoracic back pain    New Prescriptions New Prescriptions   CYCLOBENZAPRINE (FLEXERIL) 5 MG TABLET    Take 1 tablet (5 mg total) by mouth 3 (three) times daily as needed for muscle spasms.   NAPROXEN (NAPROSYN) 500 MG TABLET    Take 1 tablet (500 mg total) by mouth 2 (two) times daily.   I personally performed the services described in this documentation, which was scribed in my presence. The recorded information has been reviewed and is accurate.     Merryl Hacker, MD 08/12/16 [phone]

## 2016-08-12 NOTE — ED Notes (Addendum)
Patient updated on wait times.  Expressing frustrations with wait times and upset with this RN for explaining she might have been a minor care area earlier during triage.  Patient updated on rationale for being a level 3, and also explained that there is no exact science or list for rooming.  Patient continues to be frustrated, but returned to lobby to wait.

## 2016-08-12 NOTE — Discharge Instructions (Signed)
You were seen today for back and chest pain. Your symptoms are most suggestive of musculoskeletal pain or prodrome to shingles. Take your medications as directed. If not improving in 12-24 hours, follow-up with your primary physician. If you develop new or worsening symptoms she should be reevaluated immediately.

## 2019-02-23 IMAGING — CR DG CHEST 2V
2 series · 2 of 2 positions shown · non-contrast
Comparison: 01/24/2010

CLINICAL DATA: Left upper back pain for 1 day

EXAM:
CHEST  2 VIEW

[chest pa]
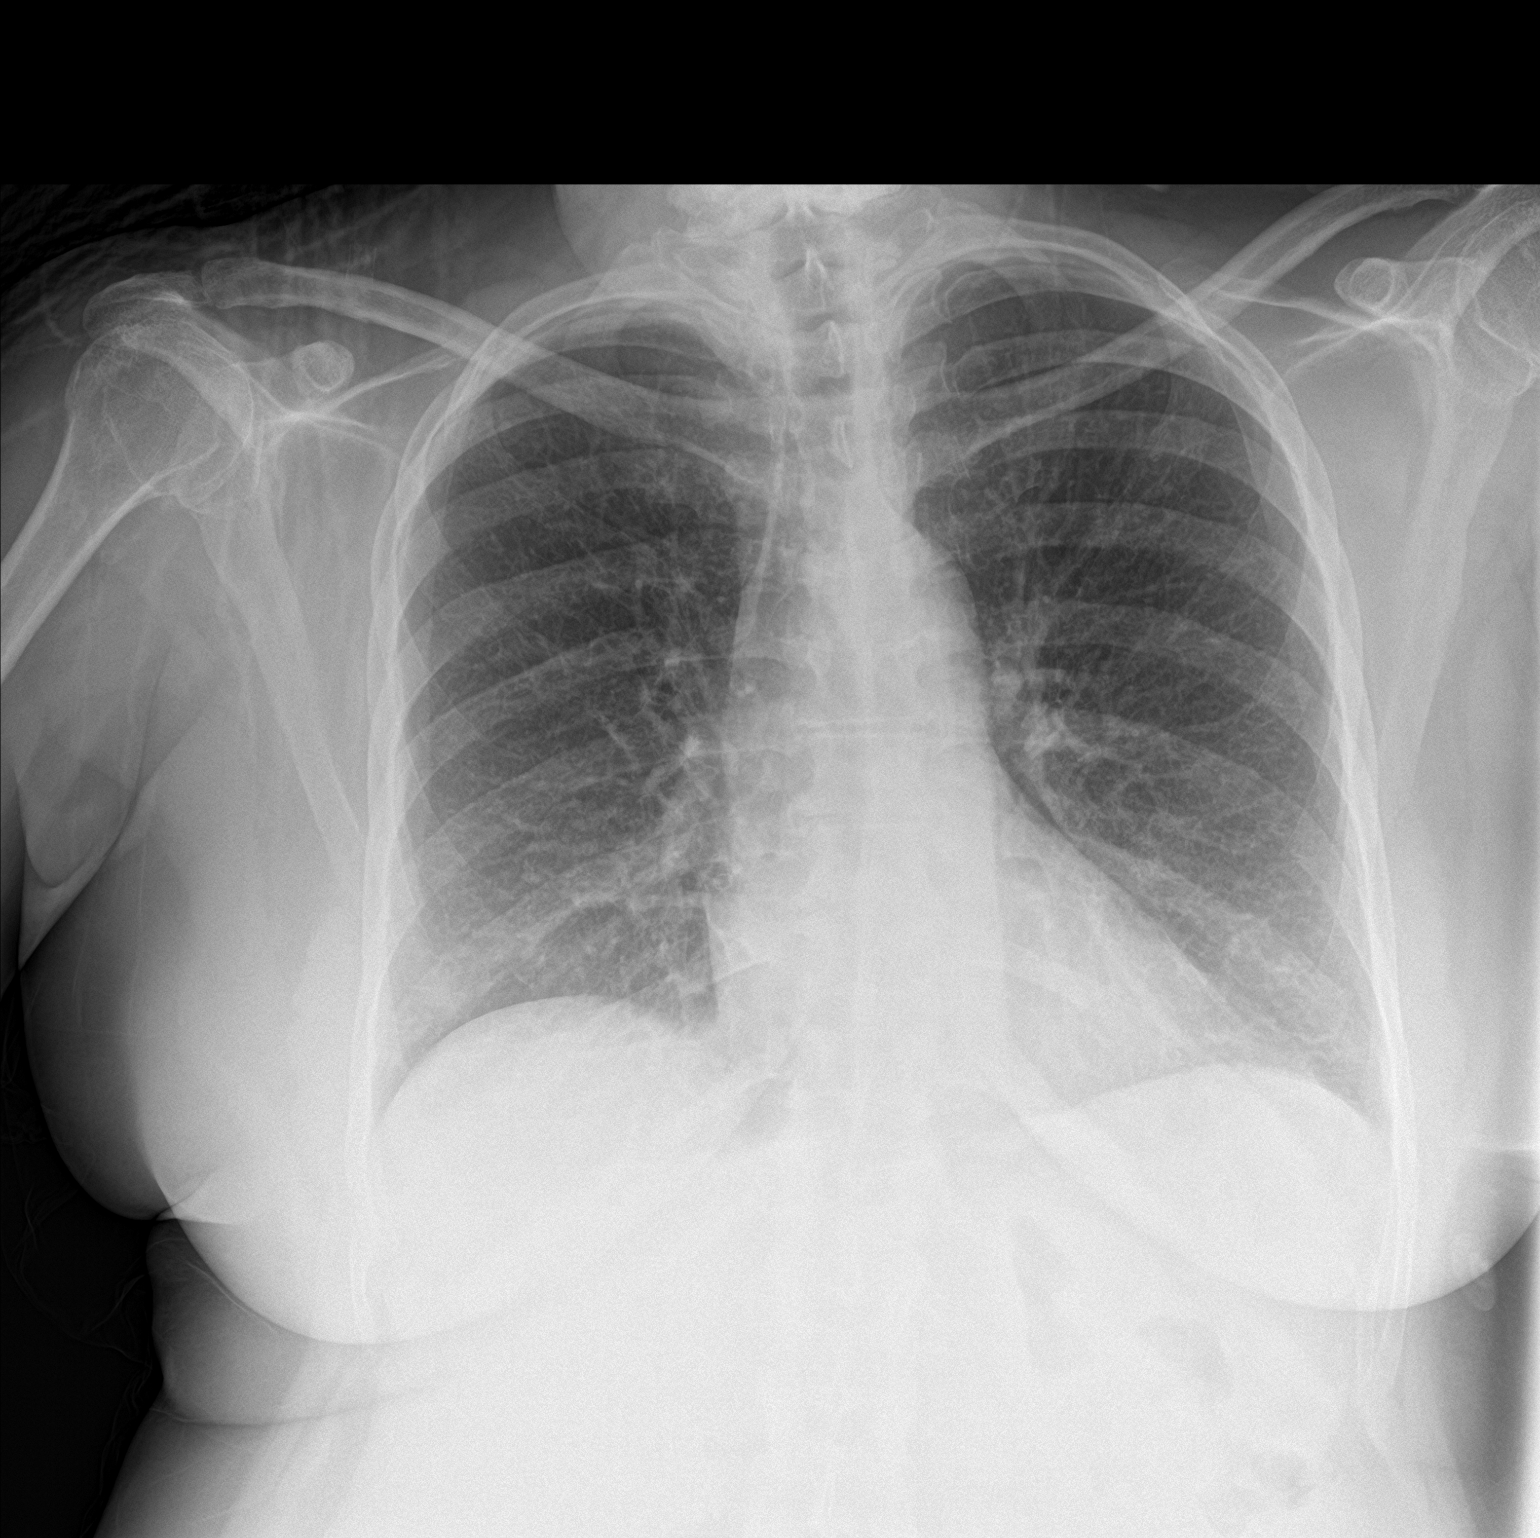

[chest lat]
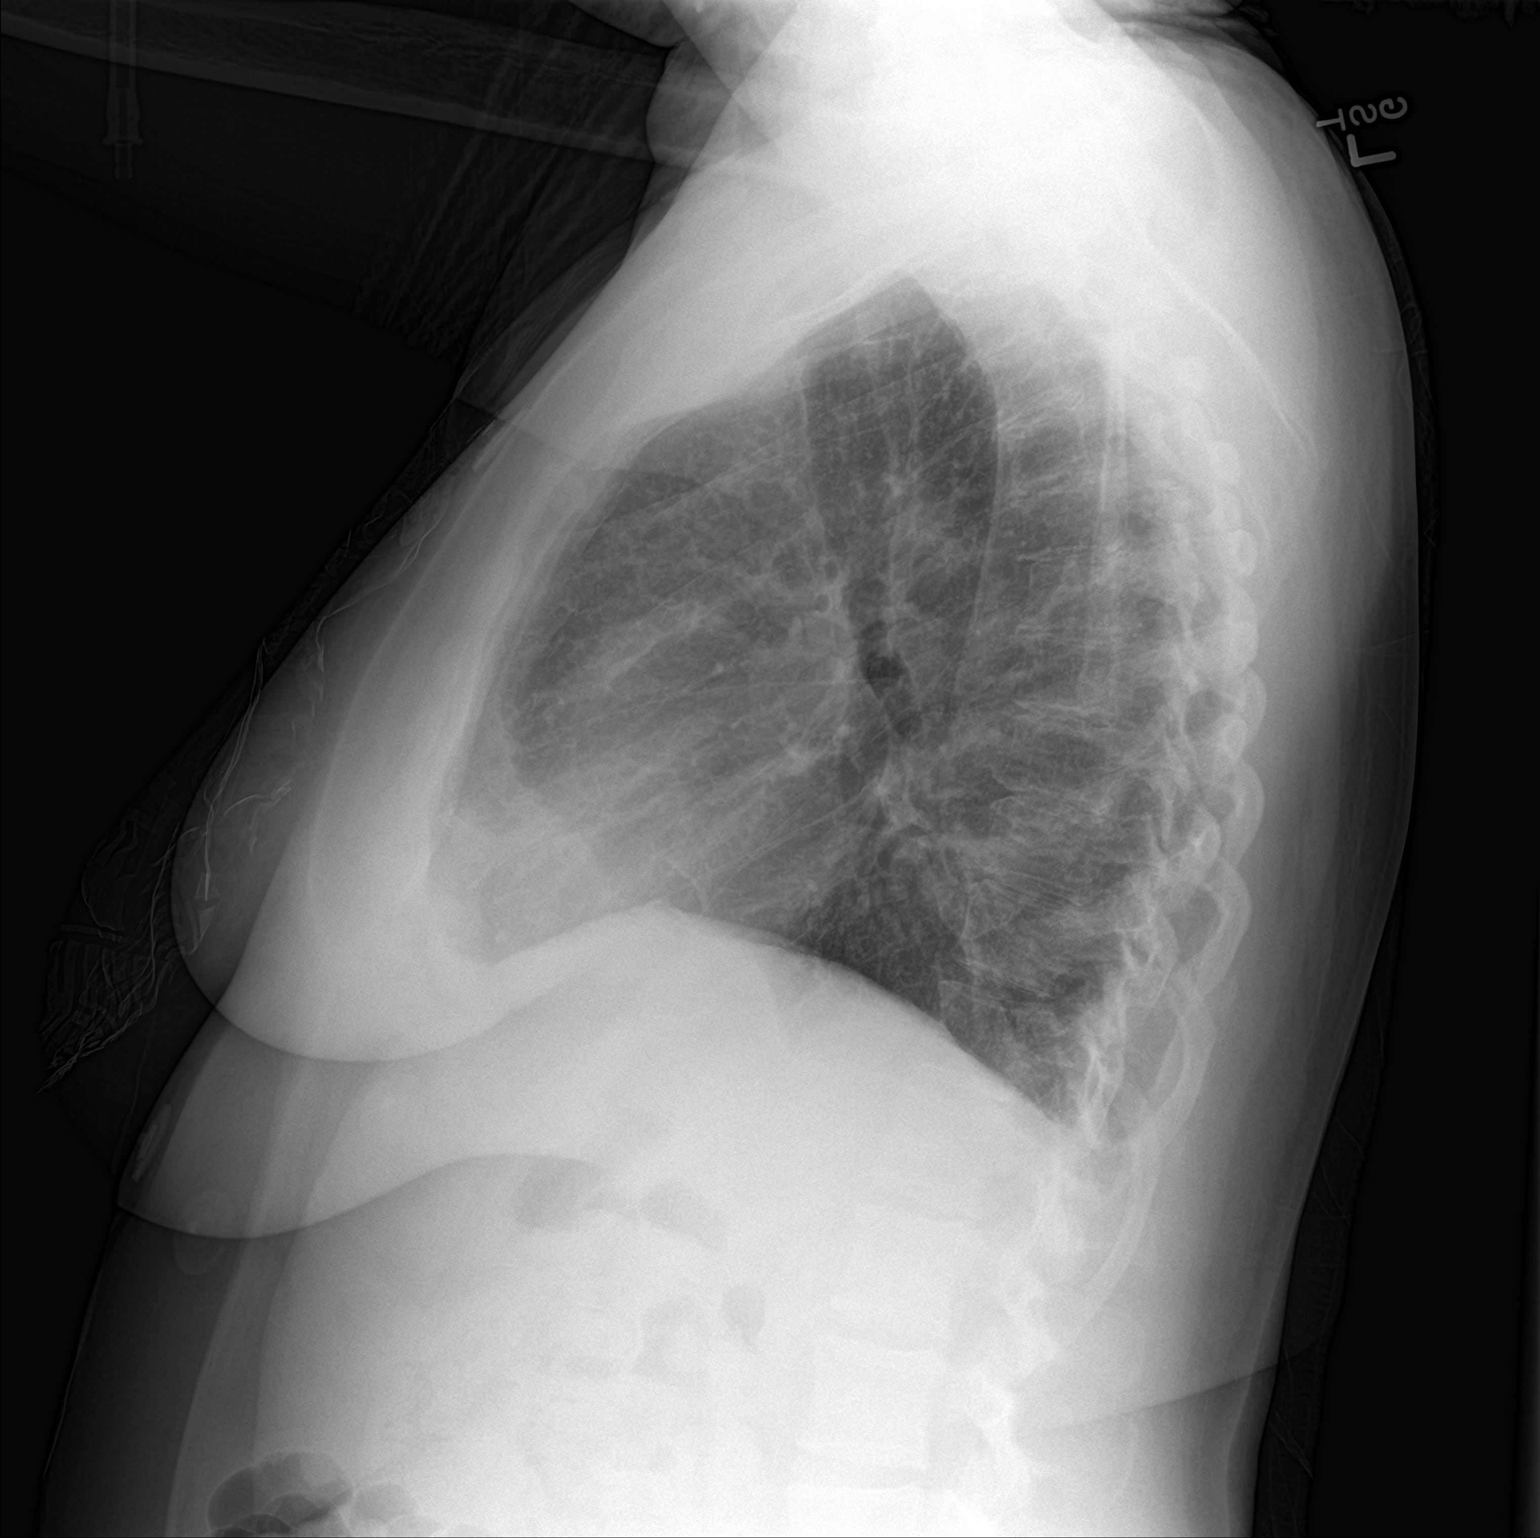

[2 of 2 positions shown; findings below may reference images not displayed]

FINDINGS: No focal pulmonary infiltrate, consolidation, or pleural effusion.
Mildly coarse interstitium, likely chronic change. Normal heart
size. No pneumothorax.
IMPRESSION: No active cardiopulmonary disease.

## 2020-09-11 ENCOUNTER — Encounter: Payer: Self-pay | Admitting: Gastroenterology
